# Patient Record
Sex: Female | Born: 2000 | Marital: Single | Smoking: Never smoker
Health system: Southern US, Community
[De-identification: ages and names within clinical notes are randomized; demographics above are authoritative.]

---
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
## 2022-02-27 ENCOUNTER — Ambulatory Visit: Payer: Self-pay

## 2022-03-11 ENCOUNTER — Emergency Department
Admission: EM | Admit: 2022-03-11 | Discharge: 2022-03-11 | Discharge status: Against Medical Advice | Payer: Medicaid Other | Attending: Student in an Organized Health Care Education/Training Program | Admitting: Student in an Organized Health Care Education/Training Program

## 2022-03-11 ENCOUNTER — Other Ambulatory Visit: Payer: Self-pay

## 2022-03-11 DIAGNOSIS — R102 Pelvic and perineal pain: Secondary | ICD-10-CM | POA: Insufficient documentation

## 2022-03-11 DIAGNOSIS — Z5321 Procedure and treatment not carried out due to patient leaving prior to being seen by health care provider: Secondary | ICD-10-CM | POA: Insufficient documentation

## 2022-03-11 DIAGNOSIS — O209 Hemorrhage in early pregnancy, unspecified: Secondary | ICD-10-CM | POA: Diagnosis present

## 2022-03-11 DIAGNOSIS — Z3A00 Weeks of gestation of pregnancy not specified: Secondary | ICD-10-CM | POA: Insufficient documentation

## 2022-03-11 LAB — COMPREHENSIVE METABOLIC PANEL
ALT: 17 U/L (ref 0–44)
AST: 17 U/L (ref 15–41)
Albumin: 4.1 g/dL (ref 3.5–5.0)
Alkaline Phosphatase: 40 U/L (ref 38–126)
Anion gap: 7 (ref 5–15)
BUN: 9 mg/dL (ref 6–20)
CO2: 25 mmol/L (ref 22–32)
Calcium: 9.7 mg/dL (ref 8.9–10.3)
Chloride: 103 mmol/L (ref 98–111)
Creatinine, Ser: 0.76 mg/dL (ref 0.44–1.00)
GFR, Estimated: >60 mL/min (ref >60)
Glucose, Bld: 104 mg/dL — ABNORMAL HIGH (ref 70–99)
Potassium: 3.8 mmol/L (ref 3.5–5.1)
Sodium: 135 mmol/L (ref 135–145)
Total Bilirubin: 0.7 mg/dL (ref 0.3–1.2)
Total Protein: 6.9 g/dL (ref 6.5–8.1)

## 2022-03-11 LAB — URINALYSIS, ROUTINE W REFLEX MICROSCOPIC
Bilirubin Urine: NEGATIVE
Glucose, UA: NEGATIVE mg/dL
Hgb urine dipstick: NEGATIVE
Ketones, ur: 5 mg/dL — AB
Leukocytes,Ua: NEGATIVE
Nitrite: NEGATIVE
Protein, ur: NEGATIVE mg/dL
Specific Gravity, Urine: 1.018 (ref 1.005–1.030)
pH: 6 (ref 5.0–8.0)

## 2022-03-11 LAB — CBC
HCT: 39.9 % (ref 36.0–46.0)
Hemoglobin: 13.1 g/dL (ref 12.0–15.0)
MCH: 26 pg (ref 26.0–34.0)
MCHC: 32.8 g/dL (ref 30.0–36.0)
MCV: 79.2 fL — ABNORMAL LOW (ref 80.0–100.0)
Platelets: 284 10*3/uL (ref 150–400)
RBC: 5.04 MIL/uL (ref 3.87–5.11)
RDW: 14.5 % (ref 11.5–15.5)
WBC: 11.6 10*3/uL — ABNORMAL HIGH (ref 4.0–10.5)
nRBC: 0 % (ref 0.0–0.2)

## 2022-03-11 LAB — HCG, QUANTITATIVE, PREGNANCY: hCG, Beta Chain, Quant, S: 178970 m[IU]/mL — ABNORMAL HIGH (ref <5)

## 2022-03-11 LAB — ABO/RH: ABO/RH(D): B POS

## 2022-03-11 LAB — POC URINE PREG, ED: Preg Test, Ur: POSITIVE — AB

## 2022-03-11 NOTE — ED Provider Triage Note (Signed)
Emergency Medicine Provider Triage Evaluation Note  Rebekah Kidd , a 21 y.o. female  was evaluated in triage.  Pt complains of vaginal bleeding and pelvic cramping x 3 days. Pregnant, but not sure how many weeks.  Physical Exam  BP (!) 136/97 (BP Location: Left Arm)   Pulse 97   Temp 98.5 F (36.9 C) (Oral)   Resp 16   Ht 5' (1.524 m)   Wt 58.1 kg   SpO2 100%   BMI 25.00 kg/m  Gen:   Awake, no distress   Resp:  Normal effort  MSK:   Moves extremities without difficulty  Other:    Medical Decision Making  Medically screening exam initiated at 5:54 PM.  Appropriate orders placed.  Rebekah Kidd was informed that the remainder of the evaluation will be completed by another provider, this initial triage assessment does not replace that evaluation, and the importance of remaining in the ED until their evaluation is complete.    Rebekah Pester, FNP 03/11/22 1856

## 2022-03-11 NOTE — ED Triage Notes (Signed)
Pt states that she is pregnant and having vaginal bleeding- pt states she does not know how far along she is- pt has been having bleeding for a few days and describes it as moderate- pt also endorses cramping

## 2023-04-10 IMAGING — US US OB < 14 WEEKS - US OB TV
1 series · 13 of 28 positions shown · non-contrast
Comparison: None.

CLINICAL DATA: Vaginal bleeding starting this morning. Unsure of
LMP. Quantitative beta HCG is 4,001

EXAM:
OBSTETRIC <14 WK US AND TRANSVAGINAL OB US
TECHNIQUE: Both transabdominal and transvaginal ultrasound examinations were
performed for complete evaluation of the gestation as well as the
maternal uterus, adnexal regions, and pelvic cul-de-sac.
Transvaginal technique was performed to assess early pregnancy.

[Series 1: us ob less than 14 weeks with ob transvaginal · 13 of 115 slices shown]
[im 5/115]
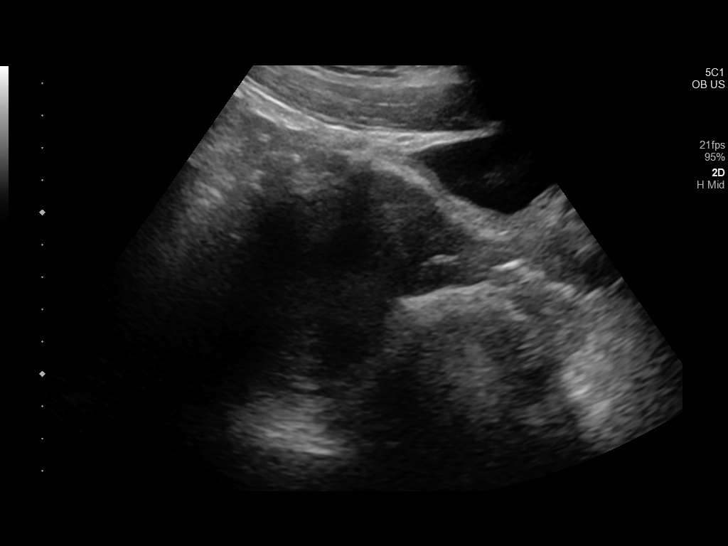
[im 13/115]
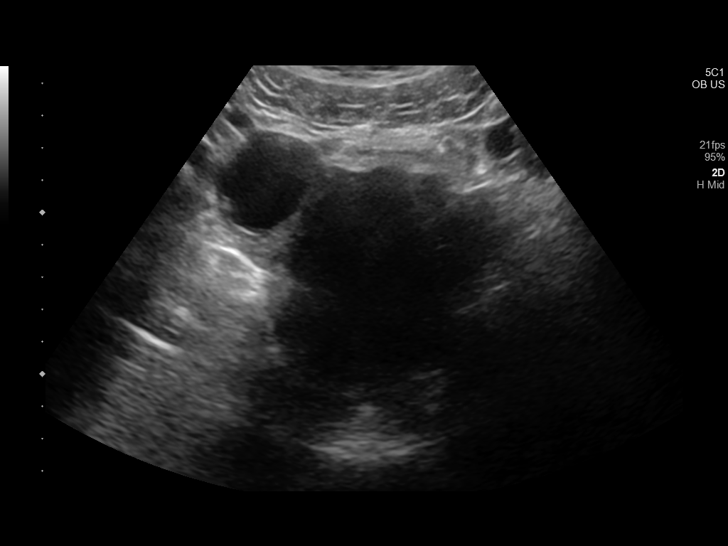
[im 22/115]
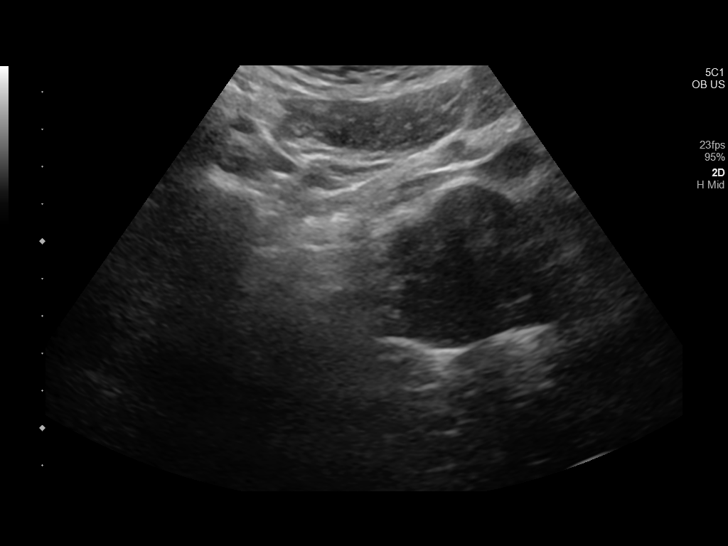
[im 30/115]
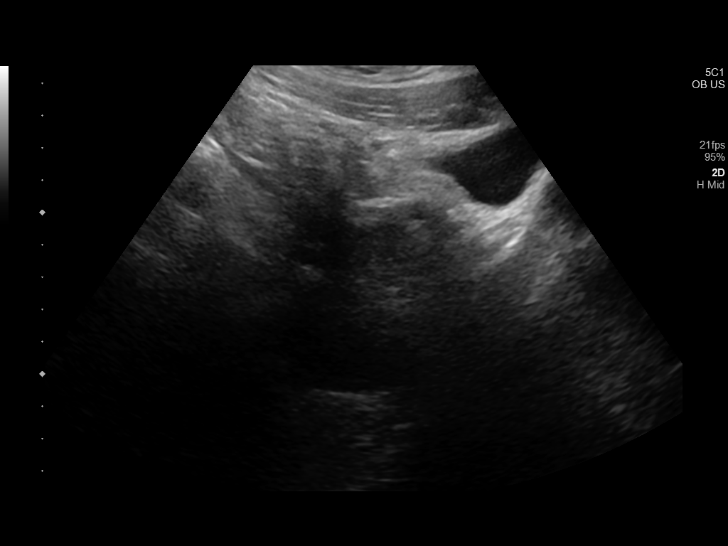
[im 39/115]
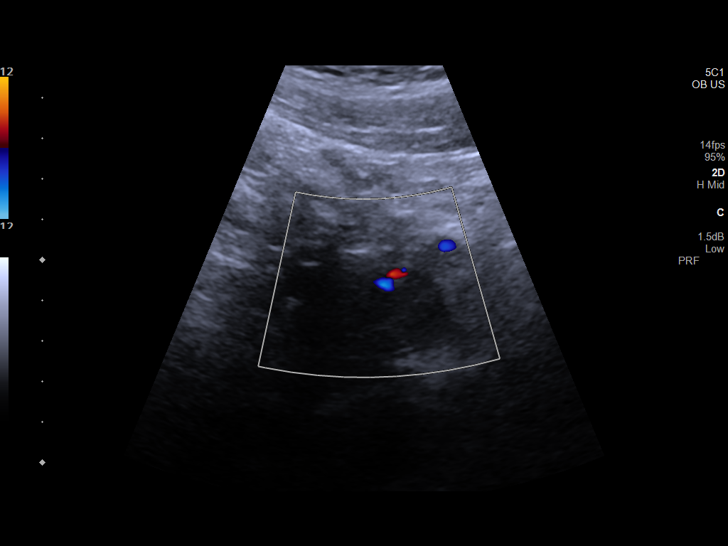
[im 47/115]
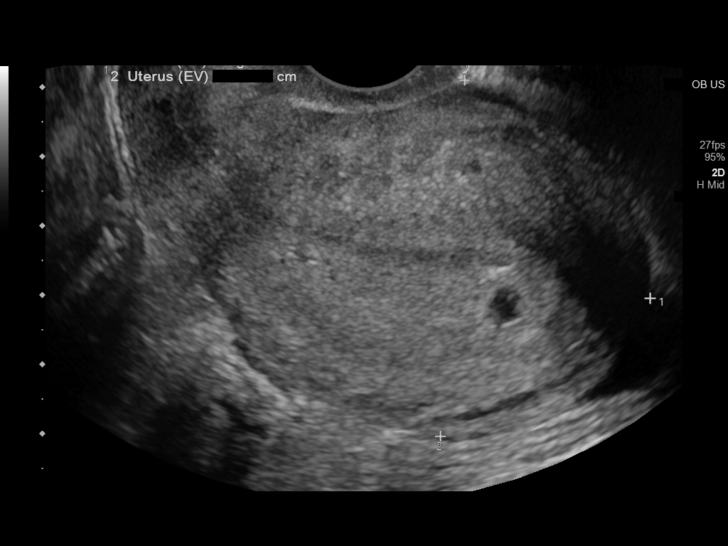
[im 60/115]
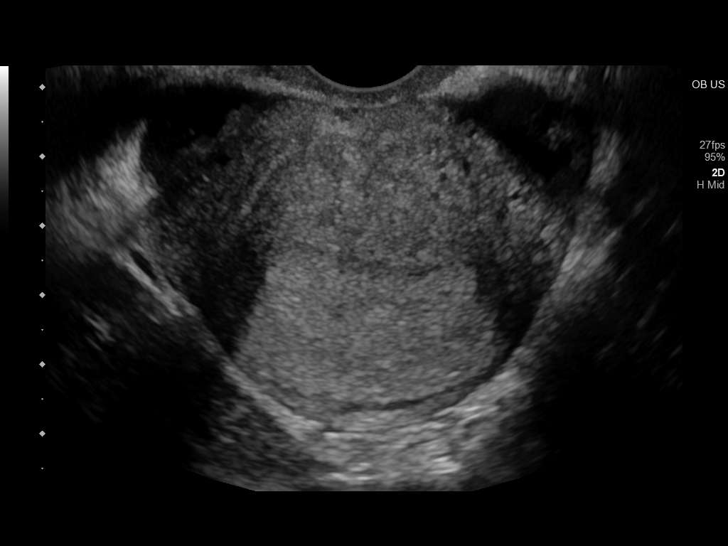
[im 68/115]
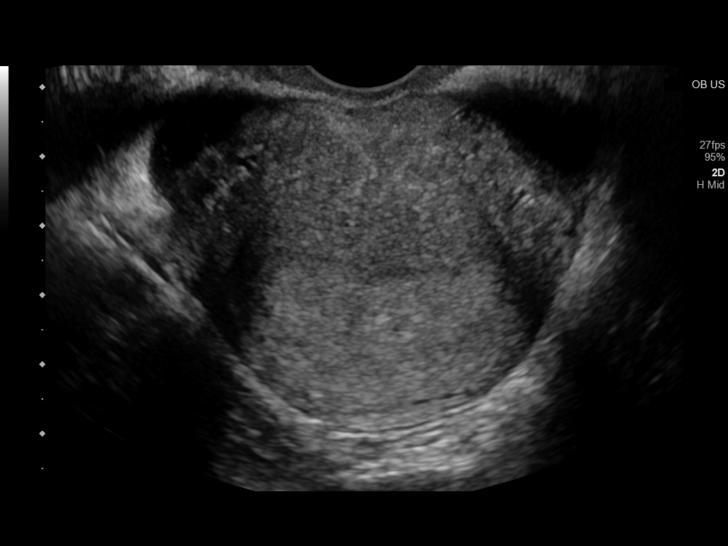
[im 77/115]
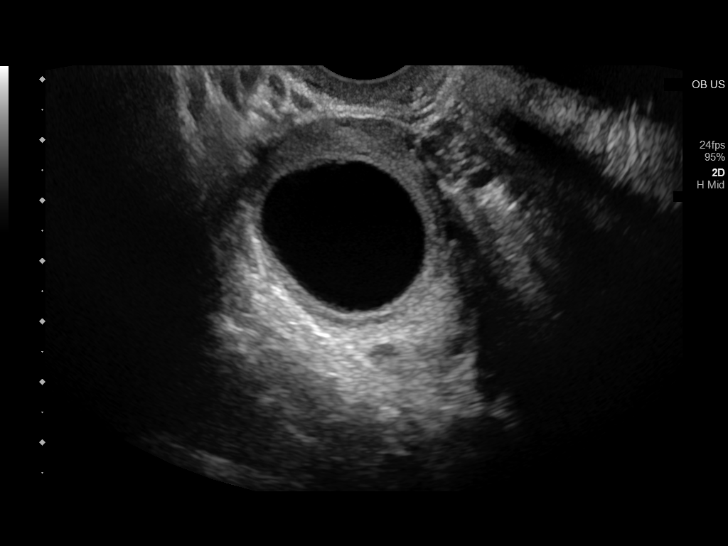
[im 85/115]
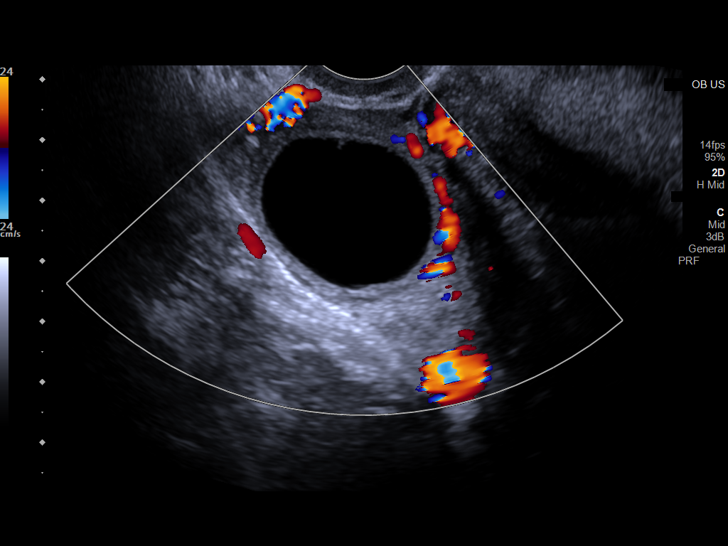
[im 93/115]
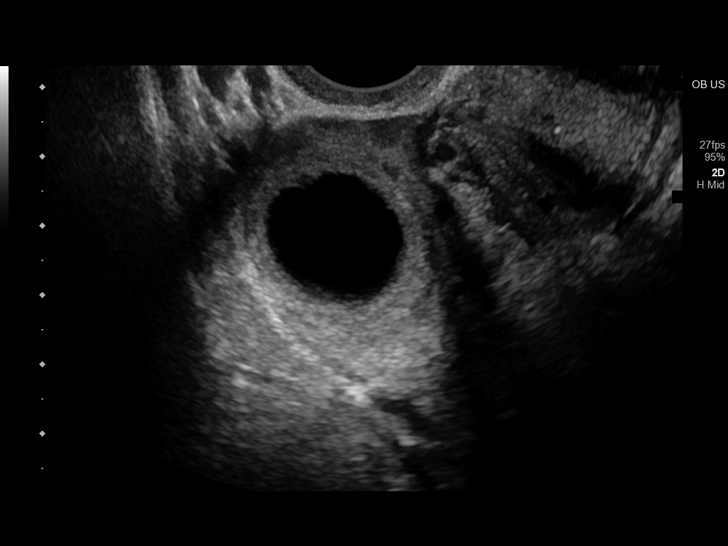
[im 102/115]
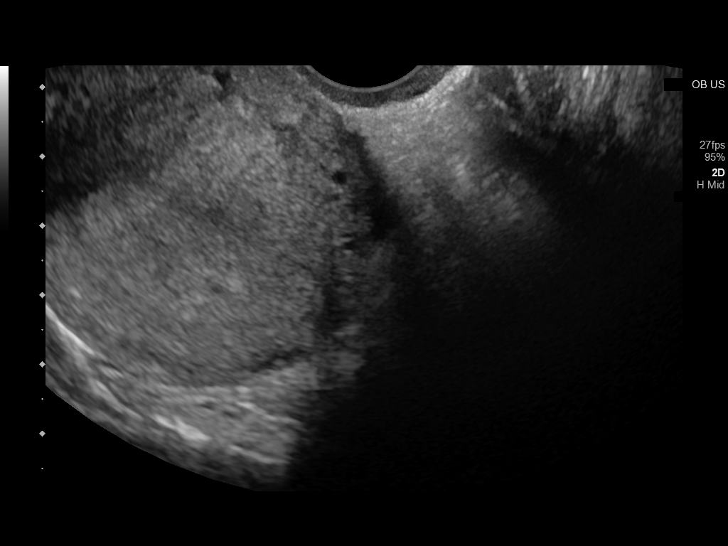
[im 110/115]
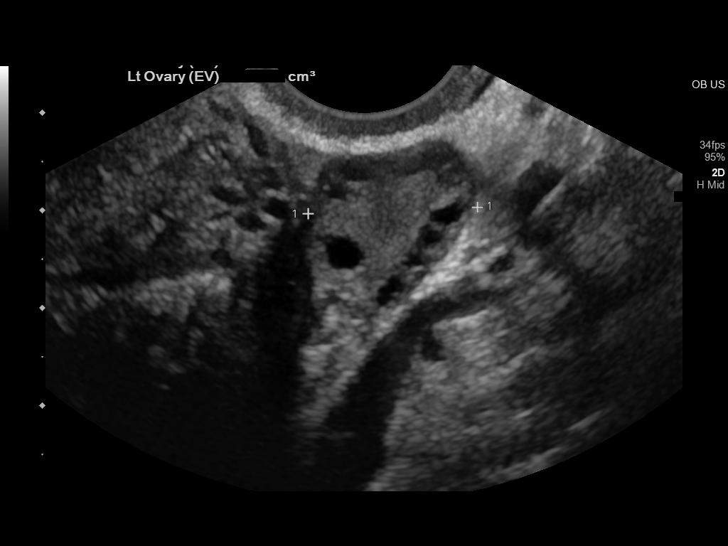

[13 of 28 positions shown; findings below may reference images not displayed]

FINDINGS: Intrauterine gestational sac: A single intrauterine gestational sac
is present.

Yolk sac:  Not Visualized.

Embryo:  Not Visualized.

Cardiac Activity: Not Visualized.

MSD: 5.2 mm   5 w   2 d

Subchorionic hemorrhage:  None visualized.

Maternal uterus/adnexae: Uterus is retroverted. No myometrial mass
lesions are suggested. Both ovaries are visualized and appear
normal. Small amount of free fluid in the pelvis.
IMPRESSION: Probable early intrauterine gestational sac, but no yolk sac, fetal
pole, or cardiac activity yet visualized. Recommend follow-up
quantitative B-HCG levels and follow-up US in 14 days to assess
viability. This recommendation follows SRU consensus guidelines:
Diagnostic Criteria for Nonviable Pregnancy Early in the First
Trimester. N Engl J Med 4381; [DATE].

## 2023-04-15 ENCOUNTER — Other Ambulatory Visit: Payer: Self-pay

## 2023-04-15 ENCOUNTER — Emergency Department
Admission: EM | Admit: 2023-04-15 | Discharge: 2023-04-15 | Discharge status: Against Medical Advice | Payer: Medicaid Other | Attending: Emergency Medicine | Admitting: Emergency Medicine

## 2023-04-15 ENCOUNTER — Emergency Department: Payer: Medicaid Other

## 2023-04-15 DIAGNOSIS — R6884 Jaw pain: Secondary | ICD-10-CM | POA: Insufficient documentation

## 2023-04-15 DIAGNOSIS — R519 Headache, unspecified: Secondary | ICD-10-CM | POA: Insufficient documentation

## 2023-04-15 DIAGNOSIS — S0993XA Unspecified injury of face, initial encounter: Secondary | ICD-10-CM | POA: Diagnosis present

## 2023-04-15 DIAGNOSIS — Z5321 Procedure and treatment not carried out due to patient leaving prior to being seen by health care provider: Secondary | ICD-10-CM | POA: Diagnosis not present

## 2023-04-15 DIAGNOSIS — W1839XA Other fall on same level, initial encounter: Secondary | ICD-10-CM | POA: Insufficient documentation

## 2023-04-15 LAB — POC URINE PREG, ED: Preg Test, Ur: NEGATIVE

## 2023-04-15 NOTE — ED Triage Notes (Addendum)
Pt to ed from home via POV for jaw pain. Pt is caox4, in no acute distress and ambulatory in triage. Pt fell and landed on her face on the concrete. Pt denies LOC, neck or back pain. Pt feels like her jaw is out of place. She can barely open and close it. Pt airway in tact, no SOB and can maintain her own secretions.
